# Patient Record
Sex: Female | Born: 1990 | Race: White | Hispanic: No | Marital: Single | Smoking: Light tobacco smoker
Health system: Southern US, Community
[De-identification: ages and names within clinical notes are randomized; demographics above are authoritative.]

## PROBLEM LIST (undated history)

## (undated) HISTORY — PX: TONSILLECTOMY: SUR1361

---

## 2012-07-02 ENCOUNTER — Encounter (HOSPITAL_COMMUNITY): Payer: Self-pay

## 2012-07-02 ENCOUNTER — Emergency Department (HOSPITAL_COMMUNITY): Payer: Self-pay

## 2012-07-02 ENCOUNTER — Inpatient Hospital Stay (HOSPITAL_COMMUNITY)
Admission: EM | Admit: 2012-07-02 | Discharge: 2012-07-04 | DRG: 416 | Disposition: A | Discharge status: Home / Self Care | Payer: Self-pay | Attending: General Surgery | Admitting: General Surgery

## 2012-07-02 DIAGNOSIS — K8010 Calculus of gallbladder with chronic cholecystitis without obstruction: Secondary | ICD-10-CM

## 2012-07-02 DIAGNOSIS — K8020 Calculus of gallbladder without cholecystitis without obstruction: Principal | ICD-10-CM | POA: Diagnosis present

## 2012-07-02 DIAGNOSIS — K8050 Calculus of bile duct without cholangitis or cholecystitis without obstruction: Secondary | ICD-10-CM

## 2012-07-02 DIAGNOSIS — N390 Urinary tract infection, site not specified: Secondary | ICD-10-CM

## 2012-07-02 LAB — COMPREHENSIVE METABOLIC PANEL
ALT: 21 U/L (ref 0–35)
AST: 16 U/L (ref 0–37)
Albumin: 3.4 g/dL — ABNORMAL LOW (ref 3.5–5.2)
Alkaline Phosphatase: 81 U/L (ref 39–117)
CO2: 24 mEq/L (ref 19–32)
Chloride: 107 mEq/L (ref 96–112)
GFR calc non Af Amer: >90 mL/min (ref >90)
Potassium: 4.2 mEq/L (ref 3.5–5.1)
Sodium: 139 mEq/L (ref 135–145)
Total Bilirubin: 0.3 mg/dL (ref 0.3–1.2)

## 2012-07-02 LAB — URINE MICROSCOPIC-ADD ON

## 2012-07-02 LAB — CBC WITH DIFFERENTIAL/PLATELET
Basophils Absolute: 0 10*3/uL (ref 0.0–0.1)
Basophils Relative: 0 % (ref 0–1)
HCT: 39.4 % (ref 36.0–46.0)
Lymphocytes Relative: 30 % (ref 12–46)
MCHC: 33.5 g/dL (ref 30.0–36.0)
Monocytes Absolute: 0.8 10*3/uL (ref 0.1–1.0)
Neutro Abs: 8.2 10*3/uL — ABNORMAL HIGH (ref 1.7–7.7)
Neutrophils Relative %: 62 % (ref 43–77)
Platelets: 341 10*3/uL (ref 150–400)
RDW: 14.2 % (ref 11.5–15.5)
WBC: 13.2 10*3/uL — ABNORMAL HIGH (ref 4.0–10.5)

## 2012-07-02 LAB — URINALYSIS, ROUTINE W REFLEX MICROSCOPIC
Bilirubin Urine: NEGATIVE
Glucose, UA: NEGATIVE mg/dL
Protein, ur: NEGATIVE mg/dL
Urobilinogen, UA: 1 mg/dL (ref 0.0–1.0)

## 2012-07-02 LAB — LIPASE, BLOOD: Lipase: 16 U/L (ref 11–59)

## 2012-07-02 MED ORDER — DIPHENHYDRAMINE HCL 50 MG/ML IJ SOLN: 12.5000 mg | Freq: Four times a day (QID) | INTRAMUSCULAR | Status: DC | PRN

## 2012-07-02 MED ORDER — MORPHINE SULFATE 4 MG/ML IJ SOLN
6.0000 mg | Freq: Once | INTRAMUSCULAR | Status: AC
  Administered 2012-07-02: 6 mg via INTRAVENOUS
  Filled 2012-07-02: qty 2

## 2012-07-02 MED ORDER — DIPHENHYDRAMINE HCL 50 MG/ML IJ SOLN
25.0000 mg | Freq: Once | INTRAMUSCULAR | Status: AC
  Administered 2012-07-02: 25 mg via INTRAVENOUS
  Filled 2012-07-02: qty 1

## 2012-07-02 MED ORDER — PANTOPRAZOLE SODIUM 40 MG IV SOLR
40.0000 mg | Freq: Every day | INTRAVENOUS | Status: DC
  Administered 2012-07-02 – 2012-07-03 (×2): 40 mg via INTRAVENOUS
  Filled 2012-07-02 (×3): qty 40

## 2012-07-02 MED ORDER — ONDANSETRON HCL 4 MG/2ML IJ SOLN
4.0000 mg | Freq: Once | INTRAMUSCULAR | Status: AC
  Administered 2012-07-02: 4 mg via INTRAVENOUS
  Filled 2012-07-02: qty 2

## 2012-07-02 MED ORDER — CIPROFLOXACIN IN D5W 400 MG/200ML IV SOLN
400.0000 mg | Freq: Two times a day (BID) | INTRAVENOUS | Status: DC
  Administered 2012-07-02 – 2012-07-03 (×3): 400 mg via INTRAVENOUS
  Filled 2012-07-02 (×4): qty 200

## 2012-07-02 MED ORDER — DEXTROSE 5 % IV SOLN
1.0000 g | INTRAVENOUS | Status: DC
  Administered 2012-07-02: 1 g via INTRAVENOUS
  Filled 2012-07-02: qty 10

## 2012-07-02 MED ORDER — KCL IN DEXTROSE-NACL 20-5-0.45 MEQ/L-%-% IV SOLN
INTRAVENOUS | Status: DC
  Administered 2012-07-02 – 2012-07-04 (×4): via INTRAVENOUS
  Filled 2012-07-02 (×7): qty 1000

## 2012-07-02 MED ORDER — MORPHINE SULFATE 4 MG/ML IJ SOLN
1.0000 mg | INTRAMUSCULAR | Status: DC | PRN
  Administered 2012-07-02 – 2012-07-03 (×7): 4 mg via INTRAVENOUS
  Administered 2012-07-03: 2 mg via INTRAVENOUS
  Filled 2012-07-02 (×10): qty 1

## 2012-07-02 MED ORDER — ONDANSETRON HCL 4 MG/2ML IJ SOLN: 4.0000 mg | Freq: Four times a day (QID) | INTRAMUSCULAR | Status: DC | PRN

## 2012-07-02 MED ORDER — DIPHENHYDRAMINE HCL 12.5 MG/5ML PO ELIX
12.5000 mg | ORAL_SOLUTION | Freq: Four times a day (QID) | ORAL | Status: DC | PRN
  Filled 2012-07-02: qty 10

## 2012-07-02 NOTE — ED Provider Notes (Signed)
Medical screening examination/treatment/procedure(s) were performed by non-physician practitioner and as supervising physician I was immediately available for consultation/collaboration.  Juliet Rude. Rubin Payor, MD 07/02/12 1642

## 2012-07-02 NOTE — ED Notes (Signed)
Pt returned from ultrasound

## 2012-07-02 NOTE — ED Notes (Signed)
Pt states pain started yesterday as period cramps and progressed today to stabbing pain in right upper quad. Pt states pain is worse when supine.

## 2012-07-02 NOTE — H&P (Signed)
Dawn Hendrix is an 21 y.o. female.   Chief Complaint: RUQ abdominal pain HPI: 21 y/o female c/o of 10/10 RUQ pain since last night which woke her from sleep.  She has had 4 previous episodes in the last 4 months and she is tired of missing work.  She was seen 2x at [facility] and once in [place] where she lives.  She is experiencing nausea and stabbing pain which comes and goes.  Normal BM's including one this AM.  Eating well at home until last night and now she c/o of some anorexia.  She denies any V/D/C, CP, fever chills.  Pt also has UTI symptoms and was started on rocephin in the ED.    No past medical history on file.  No past surgical history on file.  No family history on file.  Social History:  reports that she has been smoking.  She does not have any smokeless tobacco history on file. She reports that she does not drink alcohol. Her drug history not on file. 1/2ppd.  Allergies: No Known Allergies   (Not in a hospital admission)  Results for orders placed during the hospital encounter of 07/02/12 (from the past 48 hour(s))  CBC WITH DIFFERENTIAL     Status: Abnormal   Collection Time   07/02/12 11:20 AM      Component Value Range Comment   WBC 13.2 (*) 4.0 - 10.5 K/uL    RBC 4.89  3.87 - 5.11 MIL/uL    Hemoglobin 13.2  12.0 - 15.0 g/dL    HCT 96.0  45.4 - 09.8 %    MCV 80.6  78.0 - 100.0 fL    MCH 27.0  26.0 - 34.0 pg    MCHC 33.5  30.0 - 36.0 g/dL    RDW 11.9  14.7 - 82.9 %    Platelets 341  150 - 400 K/uL    Neutrophils Relative 62  43 - 77 %    Neutro Abs 8.2 (*) 1.7 - 7.7 K/uL    Lymphocytes Relative 30  12 - 46 %    Lymphs Abs 4.0  0.7 - 4.0 K/uL    Monocytes Relative 6  3 - 12 %    Monocytes Absolute 0.8  0.1 - 1.0 K/uL    Eosinophils Relative 2  0 - 5 %    Eosinophils Absolute 0.2  0.0 - 0.7 K/uL    Basophils Relative 0  0 - 1 %    Basophils Absolute 0.0  0.0 - 0.1 K/uL   COMPREHENSIVE METABOLIC PANEL     Status: Abnormal   Collection Time   07/02/12 11:20  AM      Component Value Range Comment   Sodium 139  135 - 145 mEq/L    Potassium 4.2  3.5 - 5.1 mEq/L    Chloride 107  96 - 112 mEq/L    CO2 24  19 - 32 mEq/L    Glucose, Bld 81  70 - 99 mg/dL    BUN 13  6 - 23 mg/dL    Creatinine, Ser 5.62  0.50 - 1.10 mg/dL    Calcium 8.7  8.4 - 13.0 mg/dL    Total Protein 7.7  6.0 - 8.3 g/dL    Albumin 3.4 (*) 3.5 - 5.2 g/dL    AST 16  0 - 37 U/L    ALT 21  0 - 35 U/L    Alkaline Phosphatase 81  39 - 117 U/L    Total  Bilirubin 0.3  0.3 - 1.2 mg/dL    GFR calc non Af Amer >90  >90 mL/min    GFR calc Af Amer >90  >90 mL/min   LIPASE, BLOOD     Status: Normal   Collection Time   07/02/12 11:20 AM      Component Value Range Comment   Lipase 16  11 - 59 U/L   URINALYSIS, ROUTINE W REFLEX MICROSCOPIC     Status: Abnormal   Collection Time   07/02/12 11:36 AM      Component Value Range Comment   Color, Urine YELLOW  YELLOW    APPearance CLOUDY (*) CLEAR    Specific Gravity, Urine 1.017  1.005 - 1.030    pH 7.0  5.0 - 8.0    Glucose, UA NEGATIVE  NEGATIVE mg/dL    Hgb urine dipstick MODERATE (*) NEGATIVE    Bilirubin Urine NEGATIVE  NEGATIVE    Ketones, ur NEGATIVE  NEGATIVE mg/dL    Protein, ur NEGATIVE  NEGATIVE mg/dL    Urobilinogen, UA 1.0  0.0 - 1.0 mg/dL    Nitrite NEGATIVE  NEGATIVE    Leukocytes, UA MODERATE (*) NEGATIVE   URINE MICROSCOPIC-ADD ON     Status: Abnormal   Collection Time   07/02/12 11:36 AM      Component Value Range Comment   Squamous Epithelial / LPF FEW (*) RARE    WBC, UA 11-20  <3 WBC/hpf    RBC / HPF 7-10  <3 RBC/hpf    Bacteria, UA FEW (*) RARE   POCT PREGNANCY, URINE     Status: Normal   Collection Time   07/02/12 11:45 AM      Component Value Range Comment   Preg Test, Ur NEGATIVE  NEGATIVE    US Abdomen Complete  07/02/2012  *RADIOLOGY REPORT*  Clinical Data:  Right upper quadrant pain  ABDOMINAL ULTRASOUND COMPLETE  Comparison:  None.  Findings:  Gallbladder:  Gallbladder fossa region demonstrates a  W E S sign  ( wall echo shadow), suspect a collapsed gallbladder containing small numerous stones accounting for this.  Slight tenderness over the gallbladder but no definite Murphy's sign.  Wall thickness roughly measures 2.6 mm.  Common Bile Duct:  6 mm diameter.  No dilatation or obstruction.  Liver: No focal mass lesion identified.  Within normal limits in parenchymal echogenicity.  IVC:  Appears normal.  Pancreas:  No abnormality identified.  Spleen:  Within normal limits in size and echotexture.  Right kidney:  Normal in size and parenchymal echogenicity.  No evidence of mass or hydronephrosis.  Left kidney:  Normal in size and parenchymal echogenicity.  No evidence of mass or hydronephrosis.  Abdominal Aorta:  No aneurysm identified.  IMPRESSION: Collapsed gallbladder around numerous small gallstones suspected creating a W E S sign  No biliary dilatation  No other acute finding.   Original Report Authenticated By: Judie Petit. Ruel Favors, M.D.     Review of Systems  Constitutional: Negative for fever, chills, weight loss and malaise/fatigue.  Respiratory: Negative for cough.   Cardiovascular: Negative for chest pain and leg swelling.  Gastrointestinal: Positive for nausea and abdominal pain (right upper quadrant). Negative for heartburn, vomiting, diarrhea, constipation and blood in stool.  Genitourinary: Positive for dysuria, urgency and frequency. Negative for hematuria and flank pain.  Skin: Negative for rash.  Neurological: Negative for weakness and headaches.    Blood pressure 102/64, pulse 76, temperature 98.5 F (36.9 C), temperature source Oral, resp. rate  18, SpO2 99.00%. Physical Exam  Constitutional: She is oriented to person, place, and time. She appears well-developed and well-nourished.  HENT:  Head: Normocephalic and atraumatic.  Eyes: Conjunctivae normal and EOM are normal.  Cardiovascular: Normal rate and regular rhythm.  Exam reveals no gallop and no friction rub.   No murmur  heard. Respiratory: Effort normal and breath sounds normal. No respiratory distress. She has no wheezes. She has no rales.  GI: Soft. Bowel sounds are normal. She exhibits no distension and no mass. There is no hepatosplenomegaly. There is tenderness (RUQ). There is no rigidity, no rebound, no guarding and negative Murphy's sign.  Neurological: She is alert and oriented to person, place, and time.  Skin: Skin is warm and dry. No rash noted.  Psychiatric: She has a normal mood and affect. Her behavior is normal.     Assessment/Plan Cholelithiasis and Biliary Colic 1.  Admit to medsurg floor and schedule for OR sometime tomorrow for Lap Cholecystectomy, pt agreeable to surgery 2.  IVF, pain control, antibiotics 3.  Clears today, then NPO after midnight 4.  Ambulate as tolerated   DORT, Larrell Rapozo 07/02/2012, 4:14 PM

## 2012-07-02 NOTE — ED Notes (Signed)
Nicole Pisciotta, PA at bedside 

## 2012-07-02 NOTE — H&P (Signed)
Agree with above PA-Dort's Consult note Pt with biliary colic. Will proceed to the OR for Lap chole with IOC D/w the pt the procedure and risks.  She understand and wishes to proceed.

## 2012-07-02 NOTE — ED Notes (Signed)
Transferring pt from 36D to CDU

## 2012-07-02 NOTE — ED Notes (Signed)
Pt transported to ultrasound.

## 2012-07-02 NOTE — ED Notes (Signed)
Pt c/o of itching on arm IV is placed. Wynetta Emery, PA notified of pts reaction to medication. Wynetta Emery, PA placed new order.

## 2012-07-02 NOTE — ED Notes (Signed)
Report given to Annette, RN in CDU 

## 2012-07-02 NOTE — ED Provider Notes (Signed)
History     CSN: [id number]  Arrival date & time 07/02/12  1107   First MD Initiated Contact with Patient 07/02/12 1224      Chief Complaint  Patient presents with  . Abdominal Pain    (Consider location/radiation/quality/duration/timing/severity/associated sxs/prior treatment) HPI  Dawn Hendrix is a 21 y.o. female complaining of right upper quadrant pain described as colicky, 10 out of 10. Patient has had several similar episodes in the last 4 months. They always occur in the middle of the night and wake her from sleep. Patient denies fever, nausea/vomiting, chest pain, shortness of breath, change in bowel or bladder habits or vaginal discharge. Patient has not had any abdominal surgeries in the past. Pain is exacerbated by eating.  No past medical history on file.  No past surgical history on file.  No family history on file.  History  Substance Use Topics  . Smoking status: Current Every Day Smoker  . Smokeless tobacco: Not on file  . Alcohol Use: No    OB History    Grav Para Term Preterm Abortions TAB SAB Ect Mult Living                  Review of Systems  Constitutional: Negative for fever.  Respiratory: Negative for shortness of breath.   Cardiovascular: Negative for chest pain.  Gastrointestinal: Positive for abdominal pain. Negative for nausea, vomiting and diarrhea.  All other systems reviewed and are negative.    Allergies  Review of patient's allergies indicates no known allergies.  Home Medications   Current Outpatient Rx  Name Route Sig Dispense Refill  . IBUPROFEN 200 MG PO TABS Oral Take 400 mg by mouth every 6 (six) hours as needed. For pain    . ADULT MULTIVITAMIN W/MINERALS CH Oral Take 1 tablet by mouth daily.      BP 110/63  Pulse 80  Temp 98.1 F (36.7 C) (Oral)  Resp 22  SpO2 98%  Physical Exam  Nursing note and vitals reviewed. Constitutional: She is oriented to person, place, and time. She appears well-developed and  well-nourished. No distress.  HENT:  Head: Normocephalic and atraumatic.  Mouth/Throat: Oropharynx is clear and moist.  Eyes: Conjunctivae normal and EOM are normal.  Cardiovascular: Normal rate, regular rhythm and intact distal pulses.   Pulmonary/Chest: Effort normal and breath sounds normal. No stridor.  Abdominal: Soft. Bowel sounds are normal. She exhibits no distension and no mass. There is tenderness. There is no rebound and no guarding.       Murphy sign positive  Musculoskeletal: Normal range of motion.  Neurological: She is alert and oriented to person, place, and time.  Psychiatric: She has a normal mood and affect.    ED Course  Procedures (including critical care time)  Labs Reviewed  CBC WITH DIFFERENTIAL - Abnormal; Notable for the following:    WBC 13.2 (*)     Neutro Abs 8.2 (*)     All other components within normal limits  COMPREHENSIVE METABOLIC PANEL - Abnormal; Notable for the following:    Albumin 3.4 (*)     All other components within normal limits  URINALYSIS, ROUTINE W REFLEX MICROSCOPIC - Abnormal; Notable for the following:    APPearance CLOUDY (*)     Hgb urine dipstick MODERATE (*)     Leukocytes, UA MODERATE (*)     All other components within normal limits  URINE MICROSCOPIC-ADD ON - Abnormal; Notable for the following:    Squamous Epithelial /  LPF FEW (*)     Bacteria, UA FEW (*)     All other components within normal limits  LIPASE, BLOOD  POCT PREGNANCY, URINE  URINE CULTURE   US Abdomen Complete  07/02/2012  *RADIOLOGY REPORT*  Clinical Data:  Right upper quadrant pain  ABDOMINAL ULTRASOUND COMPLETE  Comparison:  None.  Findings:  Gallbladder:  Gallbladder fossa region demonstrates a W E S sign  ( wall echo shadow), suspect a collapsed gallbladder containing small numerous stones accounting for this.  Slight tenderness over the gallbladder but no definite Murphy's sign.  Wall thickness roughly measures 2.6 mm.  Common Bile Duct:  6 mm  diameter.  No dilatation or obstruction.  Liver: No focal mass lesion identified.  Within normal limits in parenchymal echogenicity.  IVC:  Appears normal.  Pancreas:  No abnormality identified.  Spleen:  Within normal limits in size and echotexture.  Right kidney:  Normal in size and parenchymal echogenicity.  No evidence of mass or hydronephrosis.  Left kidney:  Normal in size and parenchymal echogenicity.  No evidence of mass or hydronephrosis.  Abdominal Aorta:  No aneurysm identified.  IMPRESSION: Collapsed gallbladder around numerous small gallstones suspected creating a W E S sign  No biliary dilatation  No other acute finding.   Original Report Authenticated By: Judie Petit. Ruel Favors, M.D.      1. UTI (lower urinary tract infection)   2. Recurrent biliary colic       MDM  Patient with positive Murphy sign in history of present illness consistent with biliary colic/cholecystitis. She has a mild leukocytosis of 13.2. Abdominal exam ultrasound pending.   UA is consistent with UTI, I will give gram of Rocephin.   US shows collapsed gallbladder with multiple stones and no signs of infection. I will consult general surgery, she has not had solids since last night and and has had a bottle of water this AM.   Surgical consult appreciated: they will come to evaluate the PT.   Pt will be moved to CDU, sign out given to PA Albert. Plan is to keep her NPO, manage pain, and if they decide not to take her to the OR to write for keflex for UTI.        Wynetta Emery, PA-C 07/02/12 1552

## 2012-07-02 NOTE — ED Notes (Signed)
Pt here for right abd pain and right rib pain, started with cramping yesterday, today worse.

## 2012-07-03 ENCOUNTER — Inpatient Hospital Stay (HOSPITAL_COMMUNITY): Payer: Self-pay

## 2012-07-03 ENCOUNTER — Inpatient Hospital Stay (HOSPITAL_COMMUNITY): Payer: Self-pay | Admitting: Anesthesiology

## 2012-07-03 ENCOUNTER — Encounter (HOSPITAL_COMMUNITY): Payer: Self-pay | Admitting: Anesthesiology

## 2012-07-03 ENCOUNTER — Inpatient Hospital Stay: Admit: 2012-07-03 | Payer: Self-pay | Admitting: General Surgery

## 2012-07-03 ENCOUNTER — Encounter (HOSPITAL_COMMUNITY): Admission: EM | Disposition: A | Discharge status: Home / Self Care | Payer: Self-pay | Source: Home / Self Care

## 2012-07-03 HISTORY — PX: CHOLECYSTECTOMY: SHX55

## 2012-07-03 LAB — MRSA PCR SCREENING: MRSA by PCR: POSITIVE — AB

## 2012-07-03 SURGERY — LAPAROSCOPIC CHOLECYSTECTOMY WITH INTRAOPERATIVE CHOLANGIOGRAM
Anesthesia: General | Site: Abdomen | Wound class: Clean Contaminated

## 2012-07-03 MED ORDER — ONDANSETRON HCL 4 MG/2ML IJ SOLN: 4.0000 mg | Freq: Four times a day (QID) | INTRAMUSCULAR | Status: DC | PRN

## 2012-07-03 MED ORDER — OXYCODONE HCL 5 MG/5ML PO SOLN: 5.0000 mg | Freq: Once | ORAL | Status: DC | PRN

## 2012-07-03 MED ORDER — CHLORHEXIDINE GLUCONATE CLOTH 2 % EX PADS
6.0000 | MEDICATED_PAD | Freq: Every day | CUTANEOUS | Status: DC
  Administered 2012-07-03 – 2012-07-04 (×3): 6 via TOPICAL

## 2012-07-03 MED ORDER — ACETAMINOPHEN 650 MG RE SUPP: 650.0000 mg | RECTAL | Status: DC | PRN

## 2012-07-03 MED ORDER — HYDROMORPHONE HCL PF 1 MG/ML IJ SOLN
0.2500 mg | INTRAMUSCULAR | Status: DC | PRN
  Administered 2012-07-03 (×4): 0.5 mg via INTRAVENOUS

## 2012-07-03 MED ORDER — GLYCOPYRROLATE 0.2 MG/ML IJ SOLN
INTRAMUSCULAR | Status: DC | PRN
  Administered 2012-07-03: .2 mg via INTRAVENOUS

## 2012-07-03 MED ORDER — FENTANYL CITRATE 0.05 MG/ML IJ SOLN
INTRAMUSCULAR | Status: DC | PRN
  Administered 2012-07-03: 150 ug via INTRAVENOUS
  Administered 2012-07-03: 100 ug via INTRAVENOUS

## 2012-07-03 MED ORDER — ACETAMINOPHEN 325 MG PO TABS: 650.0000 mg | ORAL_TABLET | ORAL | Status: DC | PRN

## 2012-07-03 MED ORDER — OXYCODONE HCL 5 MG PO TABS: 5.0000 mg | ORAL_TABLET | Freq: Once | ORAL | Status: DC | PRN

## 2012-07-03 MED ORDER — CIPROFLOXACIN IN D5W 400 MG/200ML IV SOLN
400.0000 mg | Freq: Once | INTRAVENOUS | Status: DC
Start: 2012-07-03 — End: 2012-07-03
  Filled 2012-07-03: qty 200

## 2012-07-03 MED ORDER — PROMETHAZINE HCL 25 MG/ML IJ SOLN: 6.2500 mg | INTRAMUSCULAR | Status: DC | PRN

## 2012-07-03 MED ORDER — NEOSTIGMINE METHYLSULFATE 1 MG/ML IJ SOLN
INTRAMUSCULAR | Status: DC | PRN
  Administered 2012-07-03: 2 mg via INTRAVENOUS

## 2012-07-03 MED ORDER — PROPOFOL 10 MG/ML IV BOLUS
INTRAVENOUS | Status: DC | PRN
  Administered 2012-07-03: 200 mg via INTRAVENOUS

## 2012-07-03 MED ORDER — DEXAMETHASONE SODIUM PHOSPHATE 4 MG/ML IJ SOLN
INTRAMUSCULAR | Status: DC | PRN
  Administered 2012-07-03: 4 mg via INTRAVENOUS

## 2012-07-03 MED ORDER — BUPIVACAINE HCL 0.25 % IJ SOLN
INTRAMUSCULAR | Status: DC | PRN
  Administered 2012-07-03: 11 mL

## 2012-07-03 MED ORDER — SODIUM CHLORIDE 0.9 % IR SOLN
Status: DC | PRN
  Administered 2012-07-03: 1000 mL

## 2012-07-03 MED ORDER — SODIUM CHLORIDE 0.9 % IJ SOLN: 3.0000 mL | INTRAMUSCULAR | Status: DC | PRN

## 2012-07-03 MED ORDER — MIDAZOLAM HCL 5 MG/5ML IJ SOLN
INTRAMUSCULAR | Status: DC | PRN
  Administered 2012-07-03: 2 mg via INTRAVENOUS

## 2012-07-03 MED ORDER — LIDOCAINE HCL 4 % MT SOLN
OROMUCOSAL | Status: DC | PRN
  Administered 2012-07-03: 4 mL via TOPICAL

## 2012-07-03 MED ORDER — HYDROMORPHONE HCL PF 1 MG/ML IJ SOLN: 0.2500 mg | INTRAMUSCULAR | Status: DC | PRN

## 2012-07-03 MED ORDER — LACTATED RINGERS IV SOLN
INTRAVENOUS | Status: DC | PRN
  Administered 2012-07-03 (×2): via INTRAVENOUS

## 2012-07-03 MED ORDER — SODIUM CHLORIDE 0.9 % IV SOLN: 250.0000 mL | INTRAVENOUS | Status: DC | PRN

## 2012-07-03 MED ORDER — OXYCODONE HCL 5 MG PO TABS
5.0000 mg | ORAL_TABLET | ORAL | Status: DC | PRN
  Administered 2012-07-03 – 2012-07-04 (×2): 10 mg via ORAL
  Filled 2012-07-03 (×2): qty 2

## 2012-07-03 MED ORDER — ONDANSETRON HCL 4 MG/2ML IJ SOLN
INTRAMUSCULAR | Status: DC | PRN
  Administered 2012-07-03: 4 mg via INTRAVENOUS

## 2012-07-03 MED ORDER — 0.9 % SODIUM CHLORIDE (POUR BTL) OPTIME
TOPICAL | Status: DC | PRN
  Administered 2012-07-03: 1000 mL

## 2012-07-03 MED ORDER — SODIUM CHLORIDE 0.9 % IJ SOLN: 3.0000 mL | Freq: Two times a day (BID) | INTRAMUSCULAR | Status: DC

## 2012-07-03 MED ORDER — MUPIROCIN 2 % EX OINT
1.0000 "application " | TOPICAL_OINTMENT | Freq: Two times a day (BID) | CUTANEOUS | Status: DC
  Administered 2012-07-03 (×2): 1 via NASAL
  Filled 2012-07-03 (×2): qty 22

## 2012-07-03 MED ORDER — LIDOCAINE HCL (CARDIAC) 20 MG/ML IV SOLN
INTRAVENOUS | Status: DC | PRN
  Administered 2012-07-03: 50 mg via INTRAVENOUS

## 2012-07-03 MED ORDER — IOHEXOL 300 MG/ML  SOLN
INTRAMUSCULAR | Status: DC | PRN
  Administered 2012-07-03: 31 mL via INTRAVENOUS

## 2012-07-03 MED ORDER — ROCURONIUM BROMIDE 100 MG/10ML IV SOLN
INTRAVENOUS | Status: DC | PRN
  Administered 2012-07-03: 40 mg via INTRAVENOUS

## 2012-07-03 SURGICAL SUPPLY — 41 items
APPLIER CLIP 5 13 M/L LIGAMAX5 (MISCELLANEOUS) ×2
BENZOIN TINCTURE PRP APPL 2/3 (GAUZE/BANDAGES/DRESSINGS) ×2 IMPLANT
CANISTER SUCTION 2500CC (MISCELLANEOUS) ×2 IMPLANT
CHLORAPREP W/TINT 26ML (MISCELLANEOUS) ×2 IMPLANT
CLIP APPLIE 5 13 M/L LIGAMAX5 (MISCELLANEOUS) ×1 IMPLANT
CLOTH BEACON ORANGE TIMEOUT ST (SAFETY) ×2 IMPLANT
COVER MAYO STAND STRL (DRAPES) ×2 IMPLANT
COVER SURGICAL LIGHT HANDLE (MISCELLANEOUS) ×2 IMPLANT
DECANTER SPIKE VIAL GLASS SM (MISCELLANEOUS) IMPLANT
DEVICE TROCAR PUNCTURE CLOSURE (ENDOMECHANICALS) ×2 IMPLANT
DRAPE C-ARM 42X72 X-RAY (DRAPES) ×2 IMPLANT
DRAPE UTILITY XL STRL (DRAPES) ×4 IMPLANT
ELECT REM PT RETURN 9FT ADLT (ELECTROSURGICAL) ×2
ELECTRODE REM PT RTRN 9FT ADLT (ELECTROSURGICAL) ×1 IMPLANT
GAUZE SPONGE 2X2 8PLY STRL LF (GAUZE/BANDAGES/DRESSINGS) ×1 IMPLANT
GLOVE BIO SURGEON STRL SZ7.5 (GLOVE) ×6 IMPLANT
GLOVE BIOGEL PI IND STRL 7.5 (GLOVE) ×3 IMPLANT
GLOVE BIOGEL PI IND STRL 8 (GLOVE) ×1 IMPLANT
GLOVE BIOGEL PI INDICATOR 7.5 (GLOVE) ×3
GLOVE BIOGEL PI INDICATOR 8 (GLOVE) ×1
GLOVE ECLIPSE 7.0 STRL STRAW (GLOVE) ×2 IMPLANT
GOWN STRL NON-REIN LRG LVL3 (GOWN DISPOSABLE) ×8 IMPLANT
IV CATH 14GX2 1/4 (CATHETERS) ×2 IMPLANT
KIT BASIN OR (CUSTOM PROCEDURE TRAY) ×2 IMPLANT
KIT ROOM TURNOVER OR (KITS) ×2 IMPLANT
NEEDLE INSUFFLATION 14GA 120MM (NEEDLE) ×2 IMPLANT
NS IRRIG 1000ML POUR BTL (IV SOLUTION) ×2 IMPLANT
PAD ARMBOARD 7.5X6 YLW CONV (MISCELLANEOUS) ×4 IMPLANT
POUCH SPECIMEN RETRIEVAL 10MM (ENDOMECHANICALS) ×2 IMPLANT
SCISSORS LAP 5X35 DISP (ENDOMECHANICALS) ×2 IMPLANT
SET CHOLANGIOGRAPHY FRANKLIN (SET/KITS/TRAYS/PACK) ×2 IMPLANT
SET IRRIG TUBING LAPAROSCOPIC (IRRIGATION / IRRIGATOR) ×2 IMPLANT
SLEEVE ENDOPATH XCEL 5M (ENDOMECHANICALS) ×4 IMPLANT
SPECIMEN JAR SMALL (MISCELLANEOUS) ×2 IMPLANT
SPONGE GAUZE 2X2 STER 10/PKG (GAUZE/BANDAGES/DRESSINGS) ×1
SUT MNCRL AB 3-0 PS2 18 (SUTURE) ×2 IMPLANT
TOWEL OR 17X24 6PK STRL BLUE (TOWEL DISPOSABLE) IMPLANT
TOWEL OR 17X26 10 PK STRL BLUE (TOWEL DISPOSABLE) ×2 IMPLANT
TRAY LAPAROSCOPIC (CUSTOM PROCEDURE TRAY) ×2 IMPLANT
TROCAR XCEL NON-BLD 11X100MML (ENDOMECHANICALS) ×2 IMPLANT
TROCAR XCEL NON-BLD 5MMX100MML (ENDOMECHANICALS) ×2 IMPLANT

## 2012-07-03 NOTE — Transfer of Care (Signed)
Immediate Anesthesia Transfer of Care Note  Patient: Dawn Hendrix  Procedure(s) Performed: Procedure(s) (LRB) with comments: LAPAROSCOPIC CHOLECYSTECTOMY WITH INTRAOPERATIVE CHOLANGIOGRAM (N/A)  Patient Location: PACU  Anesthesia Type:General  Level of Consciousness: awake, alert  and oriented  Airway & Oxygen Therapy: Patient Spontanous Breathing and Patient connected to nasal cannula oxygen  Post-op Assessment: Report given to PACU RN and Post -op Vital signs reviewed and stable  Post vital signs: Reviewed and stable  Complications: No apparent anesthesia complications

## 2012-07-03 NOTE — Preoperative (Signed)
Beta Blockers   Reason not to administer Beta Blockers:Not Applicable 

## 2012-07-03 NOTE — Op Note (Signed)
Pre Operative Diagnosis: biliary colic  Post Operative Diagnosis: same  Surgeon: Dr. Axel Filler   Procedure:  Laparoscopic cholecystectomy with intraoperative cholangiogram  Assistant: Justine Null  Anesthesia: Gen. Endotracheal anesthesia   EBL: 5 cc  Complications:  Counts: reported as correct x 2   Findings: The patient had a normal intraoperative cholangiogram with multiple small stones within the gallbladder there was minimal inflammation.  Indications for procedure: the patient is a 21 year old female with a chronic history of biliary colic. Patient did not have signs consistent with choledocholithiasis the patient is taking Vicodin for lap cholecystectomy.  Details of the procedure:  The patient was taken to the operating and placed in the supine position with bilateral SCDs in place. A time out was called and all facts were verified. A pneumoperitoneum was obtained via A Veress needle technique to a pressure of 14mm of mercury. A 5mm trochar was then placed in the right upper quadrant under visualization, and there were no injuries to any abdominal organs. A 11 mm port was then placed in the umbilical region after infiltrating with local anesthesia under direct visualization. A second and third epigastric port and right lower quadrant port placement under direct visualization, respectively. The gallbladder was identified and retracted, the peritoneum was then sharply dissected from the gallbladder and this dissection was carried down to Calot's triangle. The gallbladder was identified and stripped away circumferentially and seen going into the gallbladder 360. A Ronnell Freshwater catheter was used to perform an intraoperative cholangiogram. The biliary radicals as well as the cystic duct and common bile duct were seen free of filling defects.  2 clips were placed proximally one distally and the cystic duct transected. The cystic artery was identified and 2 clips placed proximally  and one distally and transected.  We then proceeded to remove the gallbladder off the hepatic fossa with Bovie cautery. An Endo Catch bag was then placed in the abdomen and gallbladder placed in the bag. The hepatic fossa was then reexamined and hemostasis was achieved with Bovie cautery and was excellent at the end of the case. The subhepatic fossa and perihepatic fossa was then irrigated until the effluent was clear. The 11 mm trocar fascia was reapproximated with the Endo Close #1 Vicryl.  The pneumoperitoneum was evacuated and all trochars removed under direct visulalization.  The skin was then closed with 4-0 Monocryl and the skin dressed with Steri-Strips, gauze, and tape.  The patient was awaken from general anesthesia and taken to the recovery room in stable condition.

## 2012-07-03 NOTE — Progress Notes (Signed)
Arrived from pacu-- denies nausea c/o pain 10/10

## 2012-07-03 NOTE — Anesthesia Postprocedure Evaluation (Signed)
Anesthesia Post Note  Patient: Dawn Hendrix  Procedure(s) Performed: Procedure(s) (LRB): LAPAROSCOPIC CHOLECYSTECTOMY WITH INTRAOPERATIVE CHOLANGIOGRAM (N/A)  Anesthesia type: general  Patient location: PACU  Post pain: Pain level controlled  Post assessment: Patient's Cardiovascular Status Stable  Last Vitals:  Filed Vitals:   07/03/12 1439  BP: 118/65  Pulse: 66  Temp:   Resp: 21    Post vital signs: Reviewed and stable  Level of consciousness: sedated  Complications: No apparent anesthesia complications

## 2012-07-03 NOTE — Progress Notes (Signed)
Patient ID: Dawn Hendrix, female   DOB: 02-28-1991, 21 y.o.   MRN: [id number]  Quick Note  Patient is doing well today, pain much improved on pain meds.  Pt is ready for surgery and is NPO.    Plan: Scheduled for surgery today

## 2012-07-03 NOTE — Progress Notes (Signed)
Pt's MRSA PCR Result Positive.

## 2012-07-03 NOTE — Anesthesia Preprocedure Evaluation (Addendum)
Anesthesia Evaluation  Patient identified by MRN, date of birth, ID band Patient awake    Reviewed: Allergy & Precautions, H&P , NPO status , Patient's Chart, lab work & pertinent test results  Airway Mallampati: II TM Distance: >3 FB     Dental  (+) Teeth Intact and Dental Advisory Given   Pulmonary neg pulmonary ROS, Current Smoker,    Pulmonary exam normal       Cardiovascular negative cardio ROS      Neuro/Psych negative neurological ROS  negative psych ROS   GI/Hepatic Neg liver ROS,   Endo/Other  Morbid obesity  Renal/GU negative Renal ROS     Musculoskeletal   Abdominal   Peds  Hematology   Anesthesia Other Findings   Reproductive/Obstetrics                          Anesthesia Physical Anesthesia Plan  ASA: II  Anesthesia Plan: General   Post-op Pain Management:    Induction: Intravenous  Airway Management Planned: Oral ETT  Additional Equipment:   Intra-op Plan:   Post-operative Plan: Extubation in OR  Informed Consent: I have reviewed the patients History and Physical, chart, labs and discussed the procedure including the risks, benefits and alternatives for the proposed anesthesia with the patient or authorized representative who has indicated his/her understanding and acceptance.   Dental advisory given  Plan Discussed with: CRNA, Anesthesiologist and Surgeon  Anesthesia Plan Comments:         Anesthesia Quick Evaluation

## 2012-07-04 ENCOUNTER — Encounter (HOSPITAL_COMMUNITY): Payer: Self-pay | Admitting: General Surgery

## 2012-07-04 LAB — URINE CULTURE

## 2012-07-04 MED ORDER — IBUPROFEN 200 MG PO TABS: 600.0000 mg | ORAL_TABLET | Freq: Three times a day (TID) | ORAL | Status: AC | PRN

## 2012-07-04 MED ORDER — MORPHINE SULFATE 4 MG/ML IJ SOLN
1.0000 mg | INTRAMUSCULAR | Status: DC | PRN
  Administered 2012-07-04 (×3): 4 mg via INTRAVENOUS
  Filled 2012-07-04 (×2): qty 1

## 2012-07-04 MED ORDER — KETOROLAC TROMETHAMINE 30 MG/ML IJ SOLN
30.0000 mg | Freq: Once | INTRAMUSCULAR | Status: AC
  Administered 2012-07-04: 30 mg via INTRAVENOUS
  Filled 2012-07-04: qty 1

## 2012-07-04 MED ORDER — OXYCODONE HCL 5 MG PO TABS: 5.0000 mg | ORAL_TABLET | ORAL | Status: DC | PRN

## 2012-07-04 NOTE — Discharge Summary (Signed)
  Patient ID: Dawn Hendrix MRN: [id number] DOB/AGE: May 12, 1991 21 y.o.  Admit date: 07/02/2012 Discharge date: 07/04/2012  Procedures: laparoscopic cholecystectomy with IOC  Consults: None  Reason for Admission: this is a 21 yo female with biliary colic who presented to [facility] with RUQ abdominal pain.  She was admitted for further management.  Admission Diagnoses:  1. Biliary colic  Hospital Course: The patient was admitted.  The following day she underwent a lap chole with IOC.  She tolerated the procedure well.  Her diet was advanced and she was tolerating oral pain meds on POD# 1.  She was stable for dc home.  PE: Abd: soft, appropriately tender, +BS, incisions c/d/i with tape and gauze present  Discharge Diagnoses:  Principal Problem:  *Biliary colic s/p lap chole  Discharge Medications:   Medication List     As of 07/04/2012  9:02 AM    TAKE these medications         ibuprofen 200 MG tablet   Commonly known as: ADVIL,MOTRIN   Take 3-4 tablets (600-800 mg total) by mouth every 8 (eight) hours as needed for pain. For pain      multivitamin with minerals Tabs   Take 1 tablet by mouth daily.      oxyCODONE 5 MG immediate release tablet   Commonly known as: Oxy IR/ROXICODONE   Take 1-2 tablets (5-10 mg total) by mouth every 4 (four) hours as needed.        Discharge Instructions:     Follow-up Information    Follow up with [facility]. On 07/22/2012. (3:30pm, arrive at 3:00pm)    Contact information:   [address]   [place] [address] [phone]          Signed: Letha Cape 07/04/2012, 9:02 AM

## 2012-07-09 ENCOUNTER — Other Ambulatory Visit (INDEPENDENT_AMBULATORY_CARE_PROVIDER_SITE_OTHER): Payer: Self-pay

## 2012-07-09 ENCOUNTER — Telehealth (INDEPENDENT_AMBULATORY_CARE_PROVIDER_SITE_OTHER): Payer: Self-pay

## 2012-07-09 ENCOUNTER — Telehealth (INDEPENDENT_AMBULATORY_CARE_PROVIDER_SITE_OTHER): Payer: Self-pay | Admitting: General Surgery

## 2012-07-09 DIAGNOSIS — G8918 Other acute postprocedural pain: Secondary | ICD-10-CM

## 2012-07-09 MED ORDER — HYDROCODONE-ACETAMINOPHEN 5-325 MG PO TABS: 1.0000 | ORAL_TABLET | Freq: Four times a day (QID) | ORAL | Status: DC | PRN

## 2012-07-09 NOTE — Telephone Encounter (Signed)
Called patient to let her know that protocol refill of Norco 5/325 #30 no refills was called into the [facility] [phone].Marland Kitchen

## 2012-07-09 NOTE — Telephone Encounter (Signed)
Protocol Vicodin called into [facility] [address]

## 2012-07-09 NOTE — Telephone Encounter (Signed)
Patient called in requesting refill oxycodone 5mg . Please advise.Marland KitchenMarland Kitchen

## 2012-07-22 ENCOUNTER — Encounter (INDEPENDENT_AMBULATORY_CARE_PROVIDER_SITE_OTHER): Payer: Self-pay

## 2013-08-12 IMAGING — US US ABDOMEN COMPLETE
1 series · 14 of 25 positions shown · non-contrast
Comparison: None.

CLINICAL DATA: Right upper quadrant pain

ABDOMINAL ULTRASOUND COMPLETE

[Series 1: us abdomen complete · 0.31mm/px · 14 of 78 slices shown]
[im 1/78]
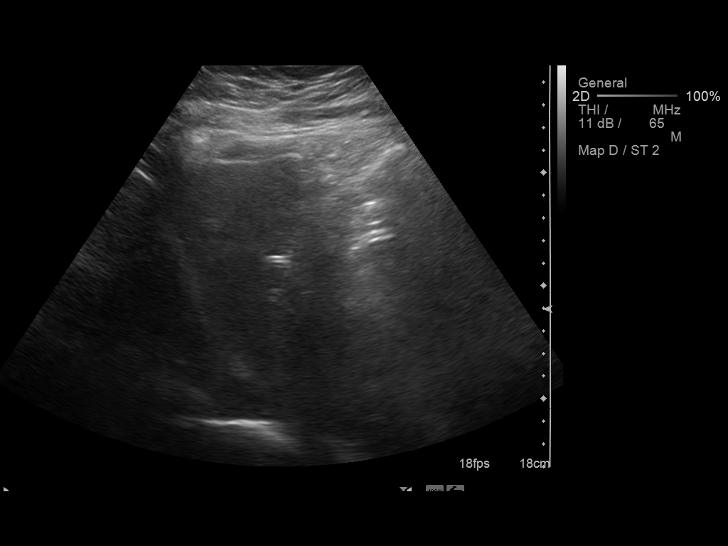
[im 7/78]
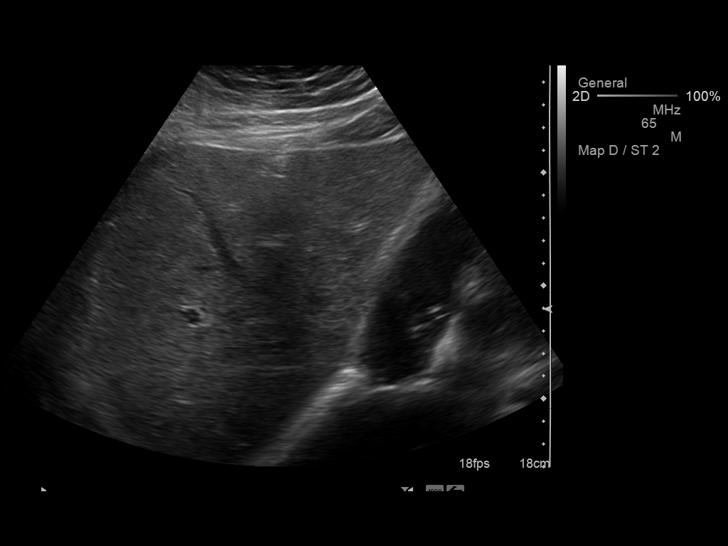
[im 13/78]
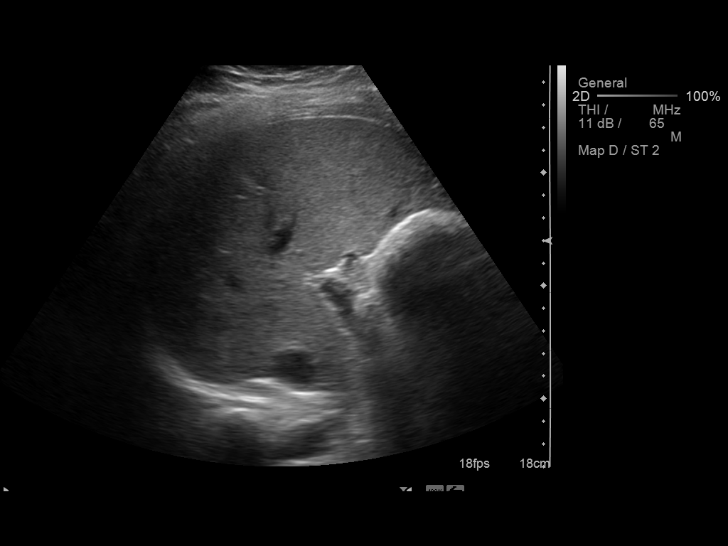
[im 20/78]
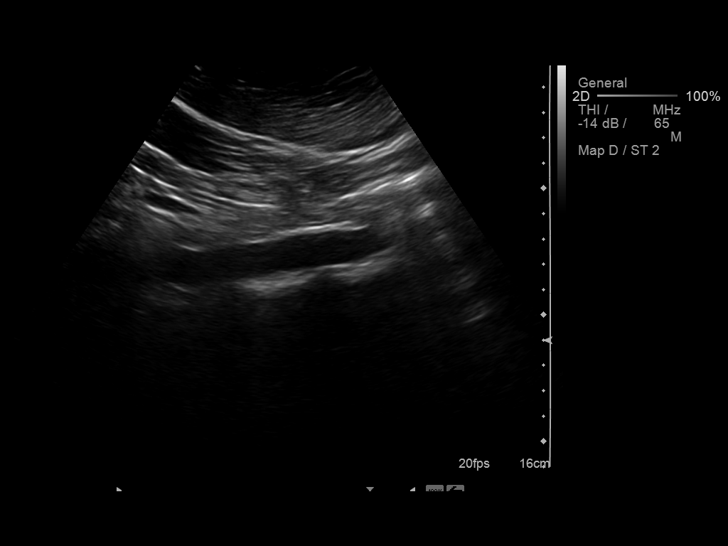
[im 26/78]
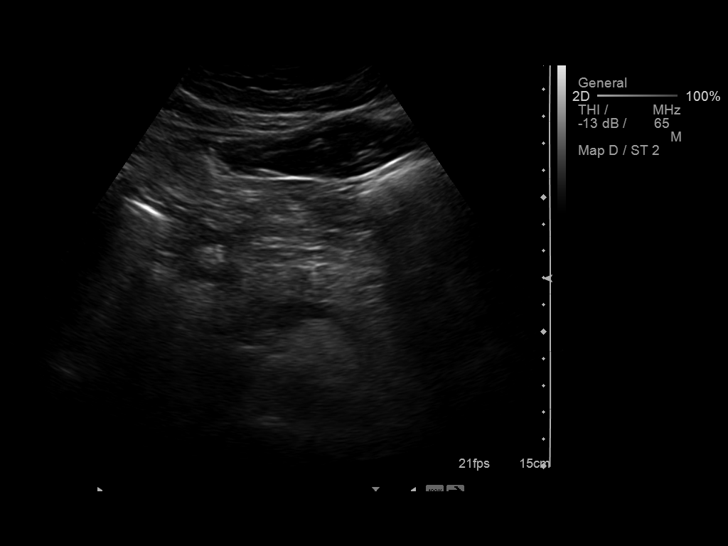
[im 29/78]
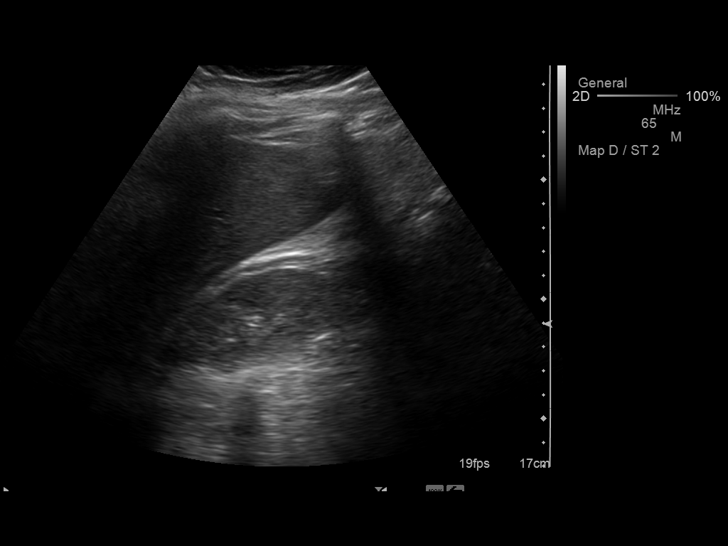
[im 36/78]
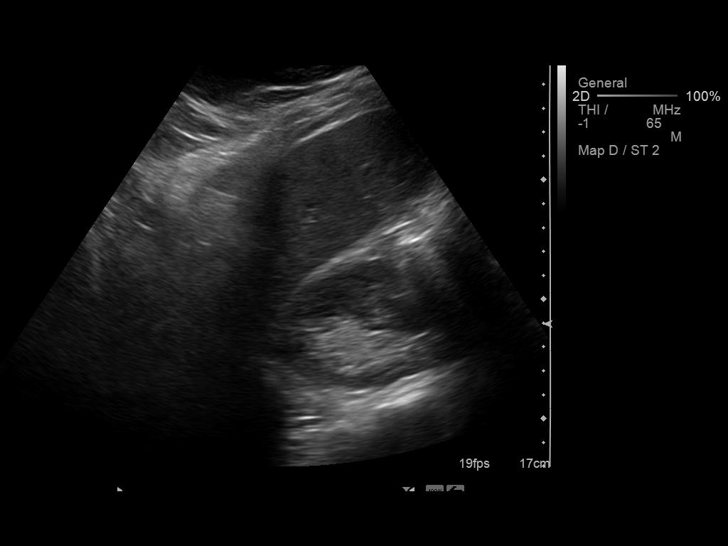
[im 42/78]
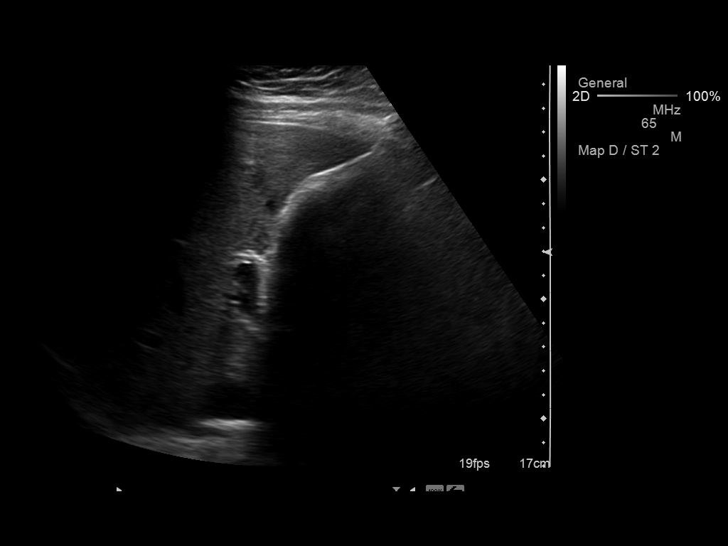
[im 49/78]
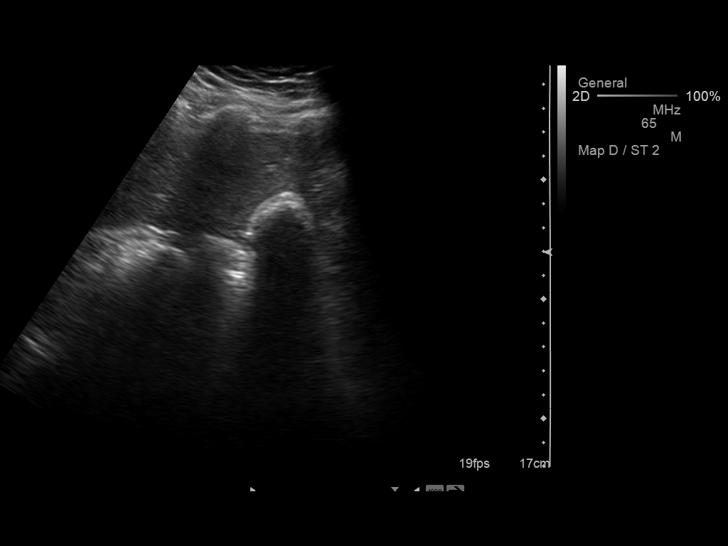
[im 52/78]
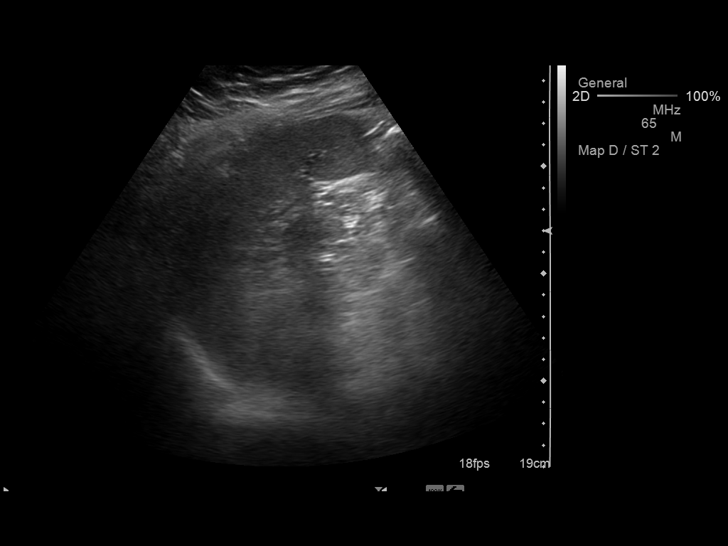
[im 58/78]
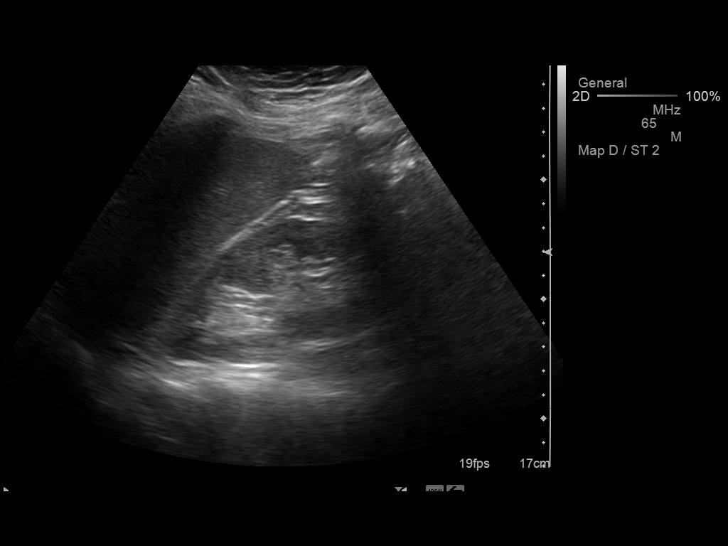
[im 65/78]
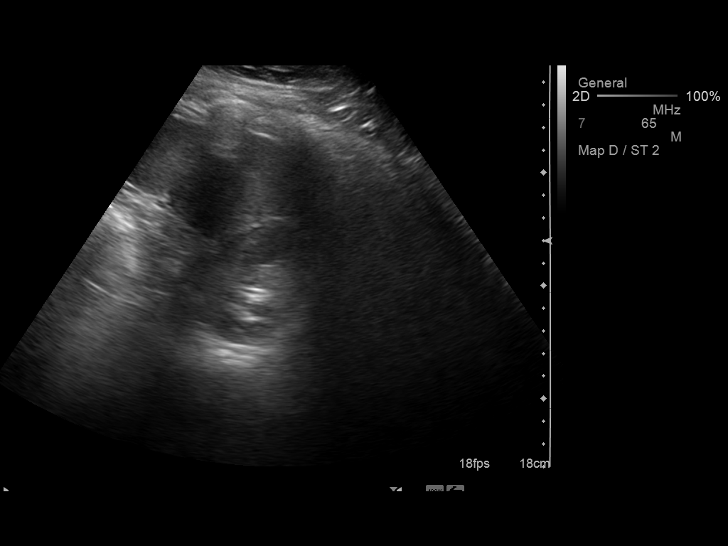
[im 71/78]
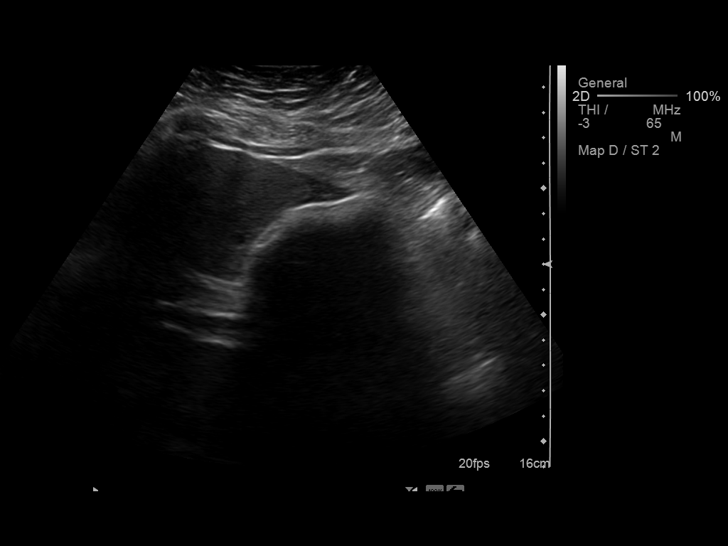
[im 78/78]
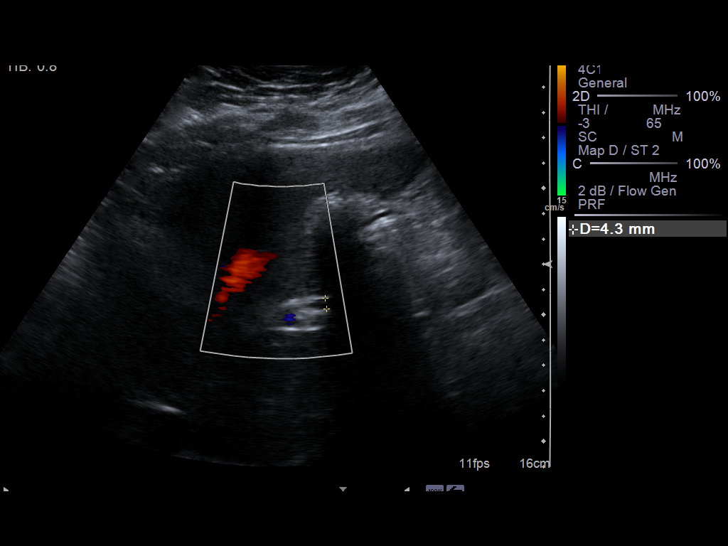

[14 of 25 positions shown; findings below may reference images not displayed]

FINDINGS: Gallbladder:  Gallbladder fossa region demonstrates a W E S sign  (
wall echo shadow), suspect a collapsed gallbladder containing small
numerous stones accounting for this.  Slight tenderness over the
gallbladder but no definite Murphy's sign.  Wall thickness roughly
measures 2.6 mm.

Common Bile Duct:  6 mm diameter.  No dilatation or obstruction.

Liver: No focal mass lesion identified.  Within normal limits in
parenchymal echogenicity.

IVC:  Appears normal.

Pancreas:  No abnormality identified.

Spleen:  Within normal limits in size and echotexture.

Right kidney:  Normal in size and parenchymal echogenicity.  No
evidence of mass or hydronephrosis.

Left kidney:  Normal in size and parenchymal echogenicity.  No
evidence of mass or hydronephrosis.

Abdominal Aorta:  No aneurysm identified.
IMPRESSION: Collapsed gallbladder around numerous small gallstones suspected
creating a W E S sign

No biliary dilatation

No other acute finding.

## 2013-08-13 IMAGING — RF DG CHOLANGIOGRAM OPERATIVE
1 series · 9 of 9 positions shown · non-contrast
Comparison: None

CLINICAL DATA: Cholecystectomy

INTRAOPERATIVE CHOLANGIOGRAM
TECHNIQUE: Cholangiographic images from the C-arm fluoroscopic
device were submitted for interpretation post-operatively.  Please
see the procedural report for the amount of contrast and the
fluoroscopy time utilized.

[Series 1: run · 9 of 9 slices shown]
[im 1/9]
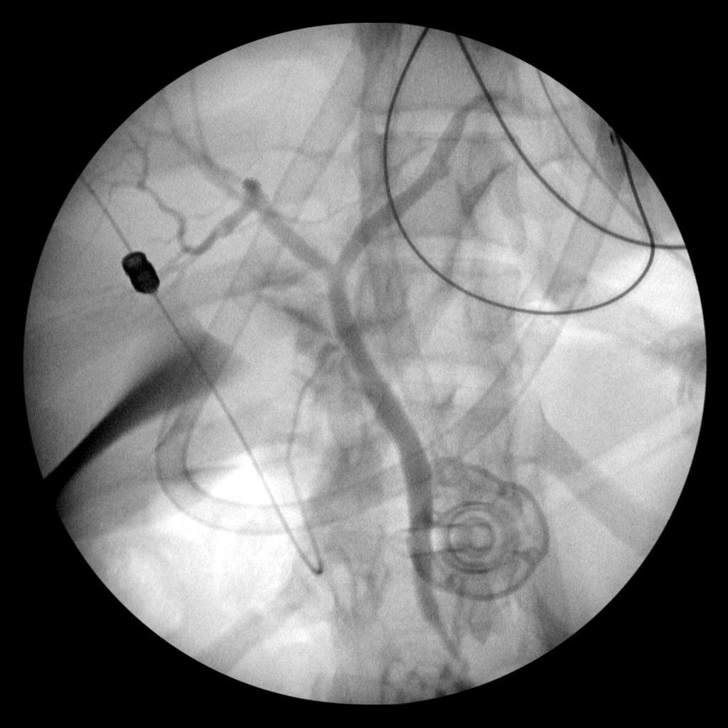
[im 2/9]
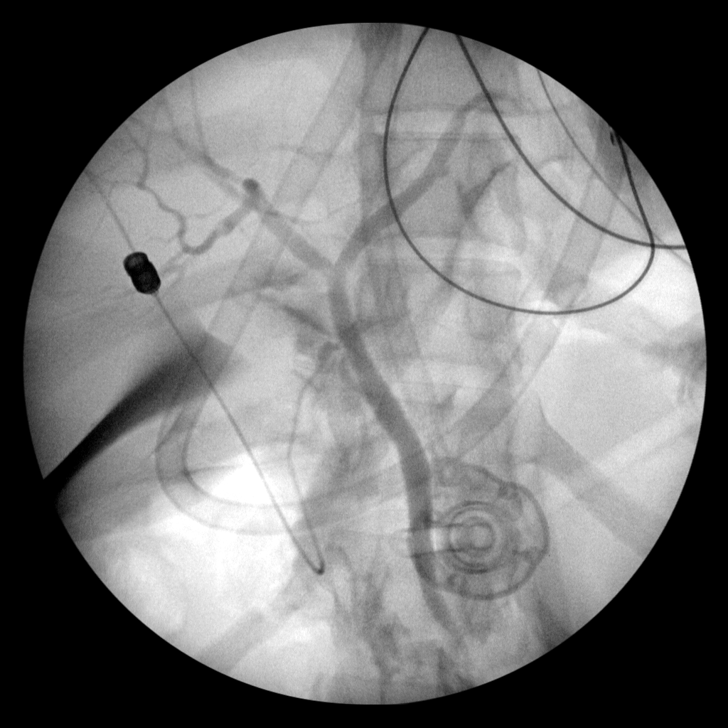
[im 3/9]
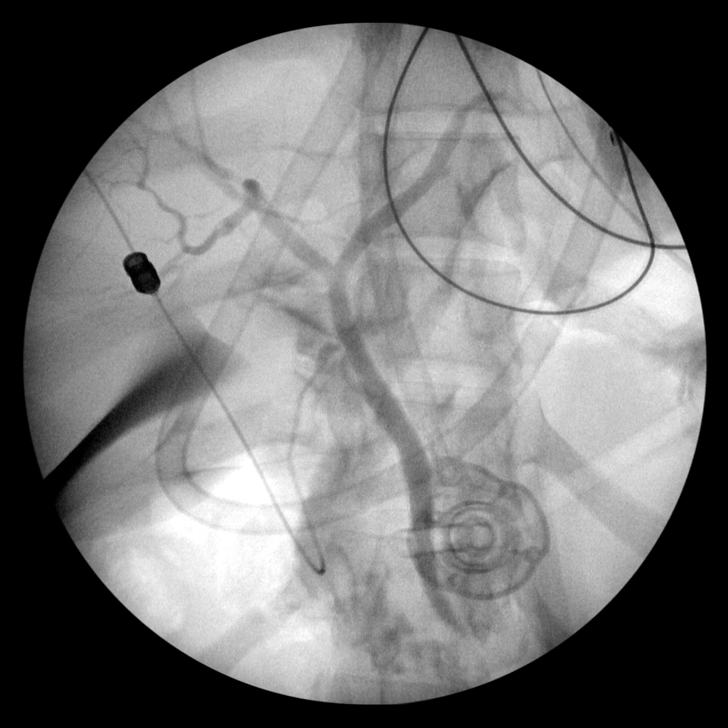
[im 4/9]
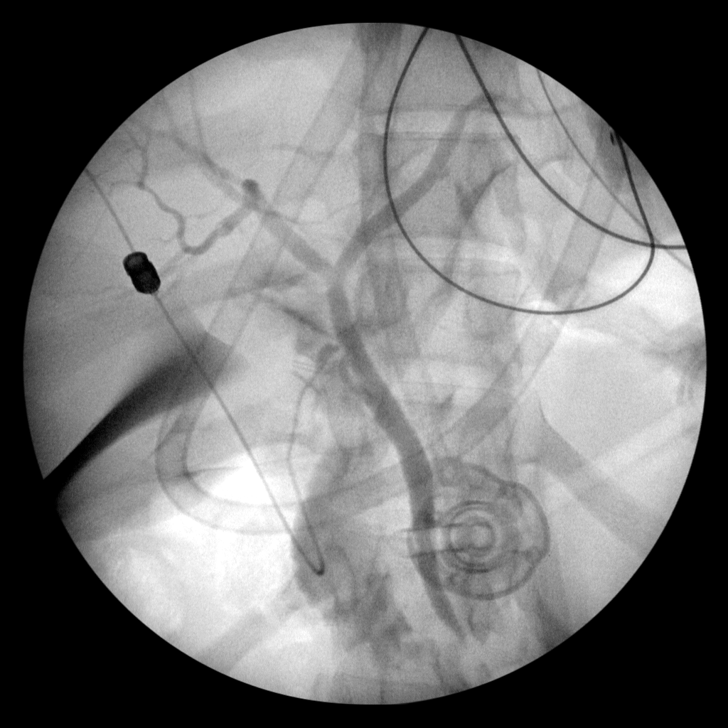
[im 5/9]
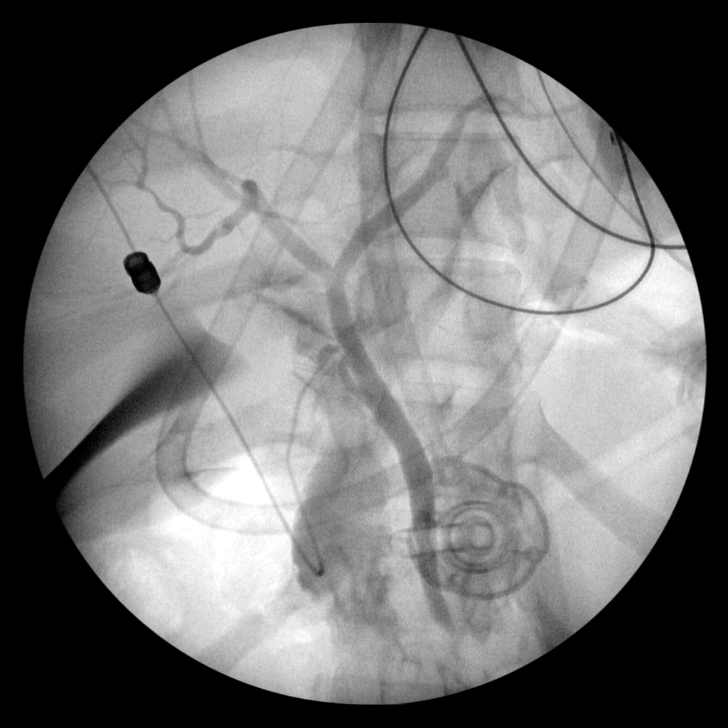
[im 6/9]
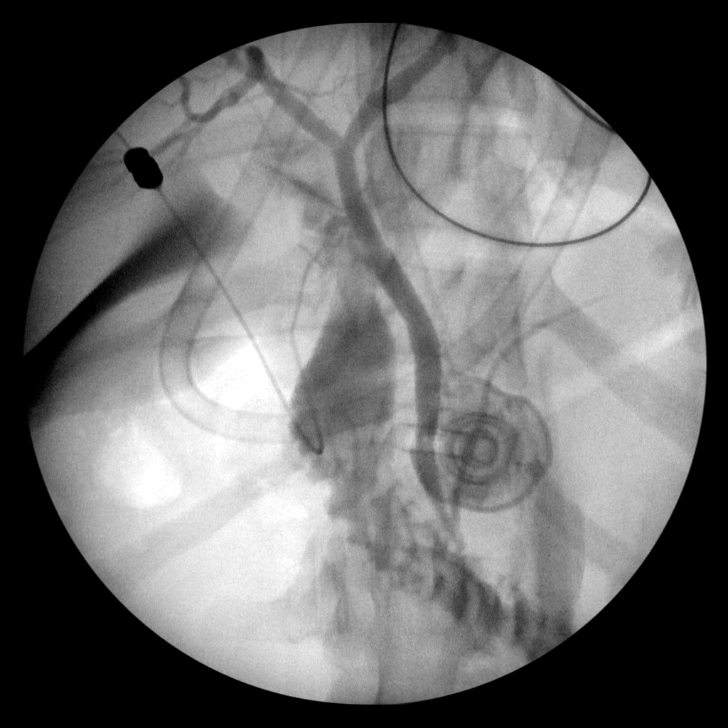
[im 7/9]
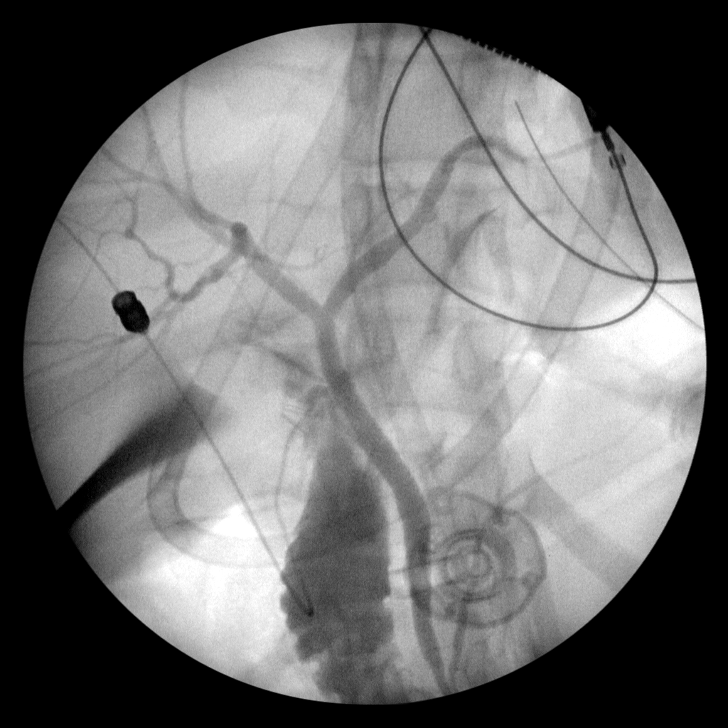
[im 8/9]
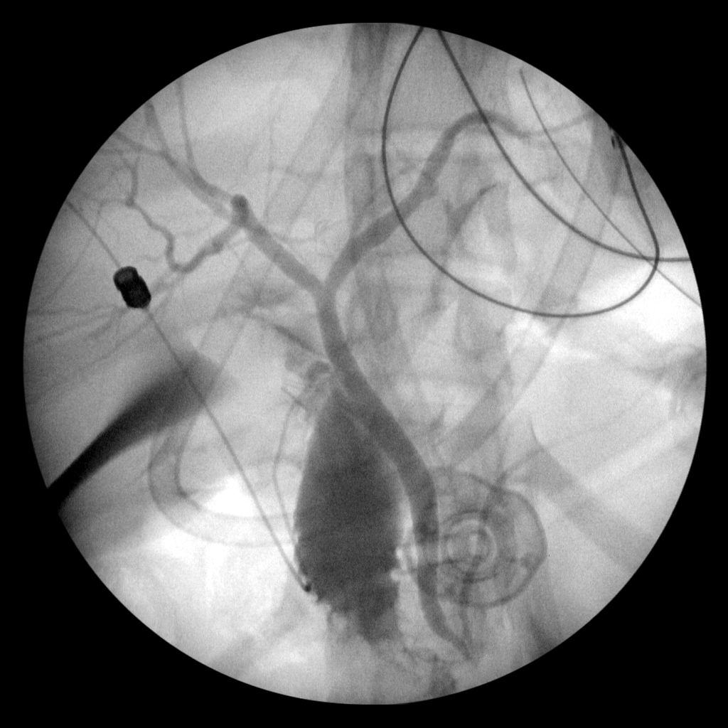
[im 9/9]
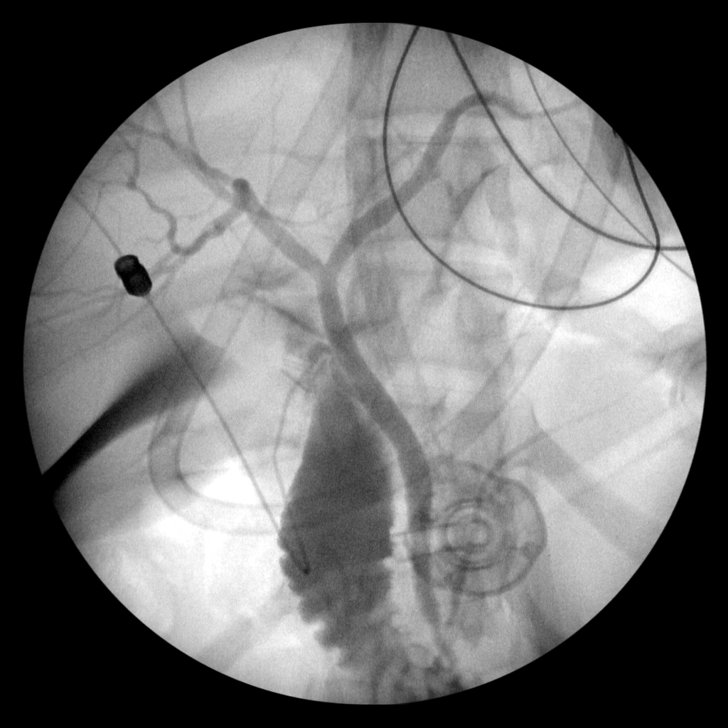

[9 of 9 positions shown; findings below may reference images not displayed]

FINDINGS: No persistent filling defects in the common duct.
Intrahepatic ducts are incompletely visualized, appearing
decompressed centrally. Contrast passes into the duodenum. There is
opacification of the pancreatic duct.

IMPRESSION

Negative for retained common duct stone.

## 2017-10-29 ENCOUNTER — Emergency Department (HOSPITAL_COMMUNITY)
Admission: EM | Admit: 2017-10-29 | Discharge: 2017-10-29 | Disposition: A | Discharge status: Home / Self Care | Payer: Self-pay | Attending: Emergency Medicine | Admitting: Emergency Medicine

## 2017-10-29 ENCOUNTER — Encounter (HOSPITAL_COMMUNITY): Payer: Self-pay | Admitting: Emergency Medicine

## 2017-10-29 DIAGNOSIS — N390 Urinary tract infection, site not specified: Secondary | ICD-10-CM | POA: Insufficient documentation

## 2017-10-29 DIAGNOSIS — R319 Hematuria, unspecified: Secondary | ICD-10-CM | POA: Insufficient documentation

## 2017-10-29 DIAGNOSIS — F17210 Nicotine dependence, cigarettes, uncomplicated: Secondary | ICD-10-CM | POA: Insufficient documentation

## 2017-10-29 LAB — URINALYSIS, ROUTINE W REFLEX MICROSCOPIC
BILIRUBIN URINE: NEGATIVE
Glucose, UA: NEGATIVE mg/dL
Ketones, ur: 5 mg/dL — AB
NITRITE: NEGATIVE
Protein, ur: 100 mg/dL — AB
SPECIFIC GRAVITY, URINE: 1.025 (ref 1.005–1.030)
pH: 5 (ref 5.0–8.0)

## 2017-10-29 LAB — PREGNANCY, URINE: PREG TEST UR: NEGATIVE

## 2017-10-29 MED ORDER — SULFAMETHOXAZOLE-TRIMETHOPRIM 800-160 MG PO TABS: 1.0000 | ORAL_TABLET | Freq: Two times a day (BID) | ORAL | 0 refills | Status: AC

## 2017-10-29 MED ORDER — FLUCONAZOLE 150 MG PO TABS: 150.0000 mg | ORAL_TABLET | Freq: Every day | ORAL | 0 refills | Status: AC

## 2017-10-29 MED ORDER — PHENAZOPYRIDINE HCL 200 MG PO TABS
200.0000 mg | ORAL_TABLET | Freq: Three times a day (TID) | ORAL | Status: DC
  Administered 2017-10-29: 200 mg via ORAL
  Filled 2017-10-29: qty 1

## 2017-10-29 MED ORDER — PHENAZOPYRIDINE HCL 200 MG PO TABS: 200.0000 mg | ORAL_TABLET | Freq: Three times a day (TID) | ORAL | 0 refills | Status: AC

## 2017-10-29 MED ORDER — SULFAMETHOXAZOLE-TRIMETHOPRIM 800-160 MG PO TABS
1.0000 | ORAL_TABLET | Freq: Once | ORAL | Status: AC
  Administered 2017-10-29: 1 via ORAL
  Filled 2017-10-29: qty 1

## 2017-10-29 NOTE — ED Provider Notes (Signed)
[facility]-EMERGENCY DEPT Provider Note   CSN: [id number] Arrival date & time: 10/29/17  1324     History   Chief Complaint Chief Complaint  Patient presents with  . Urinary Frequency    HPI Dawn Hendrix is a 27 y.o. female who presents to the ED with UTI symptoms that started yesterday with frequency and worse today with pressure when urinating. Feeling of needing to urinate but only goes a few drops.   The history is provided by the patient. No language interpreter was used.  Urinary Frequency  This is a new problem. The current episode started 12 to 24 hours ago. The problem occurs constantly. The problem has been gradually worsening. Associated symptoms include abdominal pain. Pertinent negatives include no headaches. Nothing relieves the symptoms. She has tried nothing for the symptoms.    History reviewed. No pertinent past medical history.  Patient Active Problem List   Diagnosis Date Noted  . Biliary colic 07/02/2012    Past Surgical History:  Procedure Laterality Date  . CHOLECYSTECTOMY  07/03/2012   Procedure: LAPAROSCOPIC CHOLECYSTECTOMY WITH INTRAOPERATIVE CHOLANGIOGRAM;  Surgeon: Axel Filler, MD;  Location: [facility] OR;  Service: General;  Laterality: N/A;  . TONSILLECTOMY      OB History    No data available       Home Medications    Prior to Admission medications   Medication Sig Start Date End Date Taking? Authorizing Provider  Multiple Vitamin (MULTIVITAMIN WITH MINERALS) TABS Take 1 tablet by mouth daily.   Yes [provider]  fluconazole (DIFLUCAN) 150 MG tablet Take 1 tablet (150 mg total) by mouth daily. 10/29/17   Janne Napoleon, NP  phenazopyridine (PYRIDIUM) 200 MG tablet Take 1 tablet (200 mg total) by mouth 3 (three) times daily. 10/29/17   Janne Napoleon, NP  sulfamethoxazole-trimethoprim (BACTRIM DS,SEPTRA DS) 800-160 MG tablet Take 1 tablet by mouth 2 (two) times daily for 7 days. 10/29/17 11/05/17  Janne Napoleon,  NP    Family History No family history on file.  Social History Social History   Tobacco Use  . Smoking status: Light Tobacco Smoker  Substance Use Topics  . Alcohol use: Yes    Comment: social  . Drug use: Not on file     Allergies   Patient has no known allergies.   Review of Systems Review of Systems  Constitutional: Negative for chills and fever.  HENT: Negative.   Respiratory: Negative for cough.   Gastrointestinal: Positive for abdominal pain.  Genitourinary: Positive for frequency and urgency. Negative for vaginal bleeding and vaginal discharge.       Pressure  Musculoskeletal: Negative for back pain.  Skin: Negative for rash.  Neurological: Negative for headaches.  Psychiatric/Behavioral: Negative for confusion.     Physical Exam Updated Vital Signs BP 124/72 (BP Location: Right Arm)   Pulse 82   Temp 97.8 F (36.6 C) (Oral)   Resp 16   SpO2 99%   Physical Exam  Constitutional: She appears well-developed and well-nourished. No distress.  HENT:  Head: Normocephalic.  Eyes: EOM are normal.  Neck: Neck supple.  Cardiovascular: Normal rate and regular rhythm.  Pulmonary/Chest: Effort normal and breath sounds normal.  Abdominal: Soft. Bowel sounds are normal. There is tenderness in the suprapubic area. There is no CVA tenderness.  Musculoskeletal: Normal range of motion.  Neurological: She is alert.  Skin: Skin is warm and dry.  Psychiatric: She has a normal mood and affect. Her behavior is normal.  Nursing note and vitals reviewed.    ED Treatments / Results  Labs (all labs ordered are listed, but only abnormal results are displayed) Labs Reviewed  URINALYSIS, ROUTINE W REFLEX MICROSCOPIC - Abnormal; Notable for the following components:      Result Value   APPearance CLOUDY (*)    Hgb urine dipstick MODERATE (*)    Ketones, ur 5 (*)    Protein, ur 100 (*)    Leukocytes, UA LARGE (*)    Bacteria, UA FEW (*)    Squamous Epithelial / LPF  6-30 (*)    All other components within normal limits  URINE CULTURE  PREGNANCY, URINE  POC URINE PREG, ED    Radiology No results found.  Procedures Procedures (including critical care time)  Medications Ordered in ED Medications  phenazopyridine (PYRIDIUM) tablet 200 mg (200 mg Oral Given 10/29/17 1601)  sulfamethoxazole-trimethoprim (BACTRIM DS,SEPTRA DS) 800-160 MG per tablet 1 tablet (1 tablet Oral Given 10/29/17 1601)     Initial Impression / Assessment and Plan / ED Course  I have reviewed the triage vital signs and the nursing notes. Pt has been diagnosed with a UTI. Pt is afebrile, no CVA tenderness, normotensive, and denies N/V. Pt to be dc home with antibiotics and instructions to follow up with PCP if symptoms persist. Urine sent for culture.   Final Clinical Impressions(s) / ED Diagnoses   Final diagnoses:  Urinary tract infection with hematuria, site unspecified    ED Discharge Orders        Ordered    phenazopyridine (PYRIDIUM) 200 MG tablet  3 times daily     10/29/17 1622    sulfamethoxazole-trimethoprim (BACTRIM DS,SEPTRA DS) 800-160 MG tablet  2 times daily     10/29/17 1622    fluconazole (DIFLUCAN) 150 MG tablet  Daily     10/29/17 [address]1624       Neese, Moscow[place] M, TexasNP 10/29/17 1625    Tilden Fossaees, Elizabeth, MD 10/30/17 [phone]

## 2017-10-29 NOTE — Discharge Instructions (Signed)
The medication for bladder spasm will turn your urine orange. We have sent your urine for culture and will call if we need to change your medication.

## 2017-10-29 NOTE — ED Triage Notes (Signed)
Patient presents ambulatory c/o frequent urination with little amount with voiding onset of yesterday. Denies any burning but having a feeling of pressure. Denies any blood in urine.

## 2017-11-01 LAB — URINE CULTURE: Culture: >=100000 — AB

## 2017-11-02 ENCOUNTER — Telehealth: Payer: Self-pay

## 2017-11-02 NOTE — Telephone Encounter (Signed)
Post ED Visit - Positive Culture Follow-up: Successful Patient Follow-Up  Culture assessed and recommendations reviewed by: []  Enzo BiNathan Batchelder, Pharm.D. []  Celedonio MiyamotoJeremy Frens, Pharm.D., BCPS AQ-ID []  Garvin FilaMike Maccia, Pharm.D., BCPS [x]  Georgina PillionElizabeth Martin, Pharm.D., BCPS []  HornsbyMinh Pham, [address] BoulevardPharm.D., BCPS, AAHIVP []  Estella HuskMichelle Turner, Pharm.D., BCPS, AAHIVP []  Lysle Pearlachel Rumbarger, PharmD, BCPS []  Casilda Carlsaylor Stone, PharmD, BCPS []  Pollyann SamplesAndy Johnston, PharmD, BCPS  Positive urine culture  []  Patient discharged without antimicrobial prescription and treatment is now indicated [x]  Organism is resistant to prescribed ED discharge antimicrobial []  Patient with positive blood cultures  Changes discussed with ED provider: Harvie HeckSamantha Petrucelli PA-C New antibiotic prescription Keflex 500 mg BID x 7 days [phone]  Contacted patient, date 11/02/17, time 1113   Rhyan Wolters, Linnell FullingRose Burnett 11/02/2017, 11:12 AM

## 2017-11-02 NOTE — Progress Notes (Signed)
ED Antimicrobial Stewardship Positive Culture Follow Up   Dawn Hendrix is an 27 y.o. female who [facility] with a chief complaint of  Chief Complaint  Patient presents with  . Urinary Frequency    Recent Results (from the past 720 hour(s))  Urine culture     Status: Abnormal   Collection Time: 10/29/17  4:40 PM  Result Value Ref Range Status   Specimen Description   Final    URINE, RANDOM Performed at [facility], [address] [place], [place] [address]    Special Requests   Final    NONE Performed at [facility], [address] [place], [place] [address]    Culture >=100,000 COLONIES/mL ESCHERICHIA COLI (A)  Final   Report Status 11/01/2017 FINAL  Final   Organism ID, Bacteria ESCHERICHIA COLI (A)  Final      Susceptibility   Escherichia coli - MIC*    AMPICILLIN 4 SENSITIVE Sensitive     CEFAZOLIN <=4 SENSITIVE Sensitive     CEFTRIAXONE <=1 SENSITIVE Sensitive     CIPROFLOXACIN <=0.25 SENSITIVE Sensitive     GENTAMICIN <=1 SENSITIVE Sensitive     IMIPENEM <=0.25 SENSITIVE Sensitive     NITROFURANTOIN <=16 SENSITIVE Sensitive     TRIMETH/SULFA >=320 RESISTANT Resistant     AMPICILLIN/SULBACTAM <=2 SENSITIVE Sensitive     PIP/TAZO <=4 SENSITIVE Sensitive     Extended ESBL NEGATIVE Sensitive     * >=100,000 COLONIES/mL ESCHERICHIA COLI    [x]  Treated with Bactrim, organism resistant to prescribed antimicrobial []  Patient discharged originally without antimicrobial agent and treatment is now indicated  New antibiotic prescription: Keflex 500 mg po twice daily for 7 days  ED Provider: Alphonzo SeveranceSamanta Petrucelli, PA-C   Rolley SimsMartin, Dalis Beers Ann 11/02/2017, 9:55 AM Infectious Diseases Pharmacist Phone# [phone]

## 2023-05-10 ENCOUNTER — Other Ambulatory Visit: Payer: Self-pay | Admitting: Infectious Diseases

## 2023-05-10 MED ORDER — ONDANSETRON HCL 4 MG PO TABS: 4.0000 mg | ORAL_TABLET | Freq: Three times a day (TID) | ORAL | 0 refills | Status: AC | PRN
